# Patient Record
Sex: Male | Born: 1995 | Race: White | Hispanic: No
Health system: Southern US, Community
[De-identification: ages and names within clinical notes are randomized; demographics above are authoritative.]

## PROBLEM LIST (undated history)

## (undated) HISTORY — PX: TONSILLECTOMY: SUR1361

---
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
## 2019-12-09 ENCOUNTER — Ambulatory Visit: Payer: Self-pay | Attending: Internal Medicine

## 2019-12-09 DIAGNOSIS — Z23 Encounter for immunization: Secondary | ICD-10-CM

## 2019-12-09 NOTE — Progress Notes (Signed)
   Covid-19 Vaccination Clinic  Name:  Edward Lang    MRN: [id number] DOB: 1995/08/07  12/09/2019  Edward Lang was observed post Covid-19 immunization for 15 minutes without incident. He was provided with Vaccine Information Sheet and instruction to access the V-Safe system.   Edward Lang was instructed to call 911 with any severe reactions post vaccine: Marland Kitchen Difficulty breathing  . Swelling of face and throat  . A fast heartbeat  . A bad rash all over body  . Dizziness and weakness   Immunizations Administered    Name Date Dose VIS Date Route   JANSSEN COVID-19 VACCINE 12/09/2019  9:20 AM 0.5 mL 05/10/2019 Intramuscular   Manufacturer: [employer]   Lot: [id number]   NDC: 28003-491-79

## 2020-05-19 ENCOUNTER — Ambulatory Visit (INDEPENDENT_AMBULATORY_CARE_PROVIDER_SITE_OTHER): Payer: No Typology Code available for payment source

## 2020-05-19 ENCOUNTER — Ambulatory Visit
Admission: EM | Admit: 2020-05-19 | Discharge: 2020-05-19 | Disposition: A | Discharge status: Home / Self Care | Payer: No Typology Code available for payment source | Attending: Emergency Medicine | Admitting: Emergency Medicine

## 2020-05-19 ENCOUNTER — Observation Stay (HOSPITAL_COMMUNITY)
Admission: EM | Admit: 2020-05-19 | Discharge: 2020-05-20 | Disposition: A | Discharge status: Home / Self Care | Payer: No Typology Code available for payment source | Attending: Emergency Medicine | Admitting: Emergency Medicine

## 2020-05-19 ENCOUNTER — Other Ambulatory Visit: Payer: Self-pay

## 2020-05-19 ENCOUNTER — Encounter (HOSPITAL_COMMUNITY): Payer: Self-pay | Admitting: Emergency Medicine

## 2020-05-19 ENCOUNTER — Emergency Department (HOSPITAL_COMMUNITY): Payer: No Typology Code available for payment source

## 2020-05-19 DIAGNOSIS — R000 Tachycardia, unspecified: Secondary | ICD-10-CM | POA: Insufficient documentation

## 2020-05-19 DIAGNOSIS — Z79899 Other long term (current) drug therapy: Secondary | ICD-10-CM | POA: Diagnosis not present

## 2020-05-19 DIAGNOSIS — J45909 Unspecified asthma, uncomplicated: Secondary | ICD-10-CM | POA: Diagnosis not present

## 2020-05-19 DIAGNOSIS — Z20822 Contact with and (suspected) exposure to covid-19: Secondary | ICD-10-CM | POA: Diagnosis not present

## 2020-05-19 DIAGNOSIS — J982 Interstitial emphysema: Secondary | ICD-10-CM

## 2020-05-19 DIAGNOSIS — Z91040 Latex allergy status: Secondary | ICD-10-CM | POA: Diagnosis not present

## 2020-05-19 DIAGNOSIS — R0602 Shortness of breath: Secondary | ICD-10-CM

## 2020-05-19 DIAGNOSIS — R059 Cough, unspecified: Secondary | ICD-10-CM

## 2020-05-19 DIAGNOSIS — F17220 Nicotine dependence, chewing tobacco, uncomplicated: Secondary | ICD-10-CM | POA: Insufficient documentation

## 2020-05-19 DIAGNOSIS — R079 Chest pain, unspecified: Secondary | ICD-10-CM | POA: Diagnosis present

## 2020-05-19 LAB — BASIC METABOLIC PANEL
Anion gap: 12 (ref 5–15)
BUN: 11 mg/dL (ref 6–20)
CO2: 21 mmol/L — ABNORMAL LOW (ref 22–32)
Calcium: 9.4 mg/dL (ref 8.9–10.3)
Chloride: 104 mmol/L (ref 98–111)
Creatinine, Ser: 1.06 mg/dL (ref 0.61–1.24)
GFR, Estimated: >60 mL/min (ref >60)
Glucose, Bld: 100 mg/dL — ABNORMAL HIGH (ref 70–99)
Potassium: 3.9 mmol/L (ref 3.5–5.1)
Sodium: 137 mmol/L (ref 135–145)

## 2020-05-19 LAB — CBC
HCT: 47.1 % (ref 39.0–52.0)
Hemoglobin: 16.4 g/dL (ref 13.0–17.0)
MCH: 30.4 pg (ref 26.0–34.0)
MCHC: 34.8 g/dL (ref 30.0–36.0)
MCV: 87.4 fL (ref 80.0–100.0)
Platelets: 232 10*3/uL (ref 150–400)
RBC: 5.39 MIL/uL (ref 4.22–5.81)
RDW: 12.3 % (ref 11.5–15.5)
WBC: 10.3 10*3/uL (ref 4.0–10.5)
nRBC: 0 % (ref 0.0–0.2)

## 2020-05-19 LAB — TROPONIN I (HIGH SENSITIVITY)
Troponin I (High Sensitivity): <2 ng/L (ref <18)
Troponin I (High Sensitivity): <2 ng/L (ref <18)

## 2020-05-19 MED ORDER — PREDNISONE 50 MG PO TABS: 50.0000 mg | ORAL_TABLET | Freq: Every day | ORAL | 0 refills | Status: DC

## 2020-05-19 MED ORDER — BENZONATATE 200 MG PO CAPS: 200.0000 mg | ORAL_CAPSULE | Freq: Three times a day (TID) | ORAL | 0 refills | Status: AC | PRN

## 2020-05-19 MED ORDER — ALBUTEROL SULFATE HFA 108 (90 BASE) MCG/ACT IN AERS
2.0000 | INHALATION_SPRAY | Freq: Once | RESPIRATORY_TRACT | Status: AC
  Administered 2020-05-19: 2 via RESPIRATORY_TRACT

## 2020-05-19 MED ORDER — METHYLPREDNISOLONE SODIUM SUCC 125 MG IJ SOLR
125.0000 mg | Freq: Once | INTRAMUSCULAR | Status: AC
  Administered 2020-05-19: 125 mg via INTRAMUSCULAR

## 2020-05-19 MED ORDER — FENTANYL CITRATE (PF) 100 MCG/2ML IJ SOLN
50.0000 ug | Freq: Once | INTRAMUSCULAR | Status: AC
  Administered 2020-05-19: 50 ug via INTRAVENOUS
  Filled 2020-05-19: qty 2

## 2020-05-19 MED ORDER — PIPERACILLIN-TAZOBACTAM 3.375 G IVPB
3.3750 g | Freq: Three times a day (TID) | INTRAVENOUS | Status: DC
  Administered 2020-05-19: 3.375 g via INTRAVENOUS
  Filled 2020-05-19: qty 50

## 2020-05-19 MED ORDER — IOHEXOL 300 MG/ML  SOLN
75.0000 mL | Freq: Once | INTRAMUSCULAR | Status: AC | PRN
  Administered 2020-05-19: 75 mL via INTRAVENOUS

## 2020-05-19 MED ORDER — DM-GUAIFENESIN ER 30-600 MG PO TB12: 1.0000 | ORAL_TABLET | Freq: Two times a day (BID) | ORAL | 0 refills | Status: AC

## 2020-05-19 NOTE — ED Triage Notes (Signed)
Pt c/o SOB with deep chest congestion/tightness/wheezing since yesterday. States coughing sometimes. No distress noted, speaking in complete sentences.

## 2020-05-19 NOTE — Discharge Instructions (Addendum)
Xray showing pneumomediastinum HR 115; O2 93%

## 2020-05-19 NOTE — Progress Notes (Signed)
Pharmacy Antibiotic Note  Edward Lang is a 25 y.o. male admitted on 05/19/2020 with pneumomediastinum.  Pharmacy has been consulted for Zosyn dosing. WBC 10.3. Renal function good.   Plan: Zosyn 3.375G IV q8h to be infused over 4 hours Trend WBC, temp, renal function  F/U infectious work-up  Temp (24hrs), Avg:98.4 F (36.9 C), Min:97.7 F (36.5 C), Max:99.3 F (37.4 C)  Recent Labs  Lab 05/19/20 1745  WBC 10.3  CREATININE 1.06    CrCl cannot be calculated (Unknown ideal weight.).    Allergies  Allergen Reactions  . Latex Hives   Abran Duke, PharmD, BCPS Clinical Pharmacist Phone: [phone]

## 2020-05-19 NOTE — ED Provider Notes (Signed)
[facility]    CSN: [id number] Arrival date & time: 05/19/20  1446      History   Chief Complaint Chief Complaint  Patient presents with  . Shortness of Breath    HPI Edward Lang is a 25 y.o. male history of tonsillectomy presenting today for evaluation of shortness of breath.  Reports yesterday night developed a cough, today has developed increased rhinorrhea, congestion, wheezing, shortness of breath and chest tightness.  He denies any symptoms prior to yesterday.  Reports fever of 101 this morning.  Reports history of childhood asthma, but denies any recent flares.  He denies any smoking, but does report using chewing tobacco.  He denies any close sick contacts.  Denies history of DVT/PE.  Denies recent travel immobilization or surgeries.  Denies leg pain or leg swelling.  Denies injury or trauma to chest.  Chest tightness centrally.  HPI  History reviewed. No pertinent past medical history.  There are no problems to display for this patient.   Past Surgical History:  Procedure Laterality Date  . TONSILLECTOMY         Home Medications    Prior to Admission medications   Medication Sig Start Date End Date Taking? Authorizing Provider  benzonatate (TESSALON) 200 MG capsule Take 1 capsule (200 mg total) by mouth 3 (three) times daily as needed for up to 7 days for cough. 05/19/20 05/26/20 Yes Eliav Mechling C, PA-C  dextromethorphan-guaiFENesin (MUCINEX DM) 30-600 MG 12hr tablet Take 1 tablet by mouth 2 (two) times daily. 05/19/20  Yes Carel Schnee C, PA-C  predniSONE (DELTASONE) 50 MG tablet Take 1 tablet (50 mg total) by mouth daily with breakfast for 5 days. 05/19/20 05/24/20 Yes Jhovany Weidinger C, PA-C  ibuprofen (ADVIL,MOTRIN) 200 MG tablet Take 400 mg by mouth every 6 (six) hours as needed.    [provider]  Multiple Vitamins-Minerals (CENTRUM SILVER ULTRA MENS PO) Take 1 tablet by mouth daily.    [provider]    Family  History History reviewed. No pertinent family history.  Social History Social History   Tobacco Use  . Smoking status: Never Smoker  . Smokeless tobacco: Never Used  Substance Use Topics  . Alcohol use: Yes    Comment: occ  . Drug use: Yes    Types: Marijuana     Allergies   Latex   Review of Systems Review of Systems  Constitutional: Negative for activity change, appetite change, chills, fatigue and fever.  HENT: Positive for congestion. Negative for ear pain, rhinorrhea, sinus pressure, sore throat and trouble swallowing.   Eyes: Negative for discharge and redness.  Respiratory: Positive for cough, shortness of breath and wheezing. Negative for chest tightness.   Cardiovascular: Negative for chest pain.  Gastrointestinal: Negative for abdominal pain, diarrhea, nausea and vomiting.  Musculoskeletal: Negative for myalgias.  Skin: Negative for rash.  Neurological: Negative for dizziness, light-headedness and headaches.     Physical Exam Triage Vital Signs ED Triage Vitals  Enc Vitals Group     BP      Pulse      Resp      Temp      Temp src      SpO2      Weight      Height      Head Circumference      Peak Flow      Pain Score      Pain Loc      Pain Edu?  Excl. in GC?    No data found.  Updated Vital Signs BP 116/67 (BP Location: Left Arm)   Pulse (!) 113   Temp 97.7 F (36.5 C) (Oral)   Resp 20   SpO2 94%    Heart rate rechecked and is averaging around 113, O2 92-94%.  Visual Acuity Right Eye Distance:   Left Eye Distance:   Bilateral Distance:    Right Eye Near:   Left Eye Near:    Bilateral Near:     Physical Exam Vitals and nursing note reviewed.  Constitutional:      Appearance: He is well-developed and well-nourished.     Comments: No acute distress  HENT:     Head: Normocephalic and atraumatic.     Ears:     Comments: Bilateral ears without tenderness to palpation of external auricle, tragus and mastoid, EAC's without  erythema or swelling, TM's with good bony landmarks and cone of light. Non erythematous.     Nose: Nose normal.     Mouth/Throat:     Comments: Oral mucosa pink and moist, no tonsillar enlargement or exudate. Posterior pharynx patent and nonerythematous, no uvula deviation or swelling. Normal phonation. Eyes:     Conjunctiva/sclera: Conjunctivae normal.  Cardiovascular:     Rate and Rhythm: Tachycardia present.  Pulmonary:     Effort: Pulmonary effort is normal. No respiratory distress.     Breath sounds: Wheezing present.     Comments: Breathing comfortably at rest, inspiratory and expiratory wheezing noted throughout bilateral lung fields with rhonchi Abdominal:     General: There is no distension.  Musculoskeletal:        General: Normal range of motion.     Cervical back: Neck supple.     Comments: Bilateral lower legs symmetric without calf swelling or tenderness  Skin:    General: Skin is warm and dry.  Neurological:     Mental Status: He is alert and oriented to person, place, and time.  Psychiatric:        Mood and Affect: Mood and affect normal.      UC Treatments / Results  Labs (all labs ordered are listed, but only abnormal results are displayed) Labs Reviewed - No data to display  EKG   Radiology DG Chest 2 View  Result Date: 05/19/2020 CLINICAL DATA:  Shortness of breath with cough and tachycardia EXAM: CHEST - 2 VIEW COMPARISON:  July 05, 2011 FINDINGS: There is apparent pneumomediastinum. Lungs are clear. The heart size and pulmonary vascularity are normal. No adenopathy. No bone lesions. No pneumothorax. IMPRESSION: Pneumomediastinum. No pneumothorax. Lungs clear. Heart size normal. These results will be called to the ordering clinician or representative by the Radiologist Assistant, and communication documented in the PACS or [employer]. Electronically Signed   By: Bretta Bang III M.D.   On: 05/19/2020 15:54    Procedures Procedures (including  critical care time)  Medications Ordered in UC Medications  albuterol (VENTOLIN HFA) 108 (90 Base) MCG/ACT inhaler 2 puff (2 puffs Inhalation Given 05/19/20 1529)  methylPREDNISolone sodium succinate (SOLU-MEDROL) 125 mg/2 mL injection 125 mg (125 mg Intramuscular Given 05/19/20 1529)    Initial Impression / Assessment and Plan / UC Course  I have reviewed the triage vital signs and the nursing notes.  Pertinent labs & imaging results that were available during my care of the patient were reviewed by me and considered in my medical decision making (see chart for details).  Clinical Course as of 05/19/20 1619  Wed May 19, 2020  1545 Hr improving to around 100; O2 briefly up to 97%, improved to averaging 94% [HW]    Clinical Course User Index [HW] Iniko Robles C, PA-C    X-ray showed pneumomediastinum, no pneumothorax, discussed with Dr. Tracie Harrier, recommended further evaluation in emergency room given his tachycardia, lower O2 and chest discomfort.  Did provide inhaler and steroids initially for wheezing with slight improvement, but not fully resolved.  Patient driven by grandmother; sending via private vehicle to ED for further evaluation, possible further imaging to ensure patient stable for outpatient treatment versus inpatient.  Discussed strict return precautions. Patient verbalized understanding and is agreeable with plan.    Final Clinical Impressions(s) / UC Diagnoses   Final diagnoses:  Pneumomediastinum (HCC)     Discharge Instructions     Xray showing pneumomediastinum HR 115; O2 93%    ED Prescriptions    Medication Sig Dispense Auth. Provider   predniSONE (DELTASONE) 50 MG tablet Take 1 tablet (50 mg total) by mouth daily with breakfast for 5 days. 5 tablet Zayana Salvador C, PA-C   benzonatate (TESSALON) 200 MG capsule Take 1 capsule (200 mg total) by mouth 3 (three) times daily as needed for up to 7 days for cough. 28 capsule Kacee Sukhu C, PA-C    dextromethorphan-guaiFENesin (MUCINEX DM) 30-600 MG 12hr tablet Take 1 tablet by mouth 2 (two) times daily. 20 tablet Nayeliz Hipp, [place] C, PA-C     PDMP not reviewed this encounter.   Lew Dawes, [place] 05/19/20 1619

## 2020-05-19 NOTE — ED Triage Notes (Signed)
Patient sent from Urgent Care for further evaluation of pneumomediasteinum. Patient first noticed chest tightness last night. Patient is alert, oriented, and in no apparent distress at this time.

## 2020-05-19 NOTE — ED Provider Notes (Addendum)
[facility] EMERGENCY DEPARTMENT Provider Note   CSN: [id number] Arrival date & time: 05/19/20  1645     History Chief Complaint  Patient presents with  . Pneumomediasteinum    Edward Lang is a 25 y.o. male.  Patient presents to the emergency department with a chief complaint of chest pain and shortness of breath.  He states his symptoms started yesterday.  He reports that he has had some cough, as well as 2 episodes of posttussive emesis.  He states that this morning he woke with a fever of 101.  He has coughed up yellow sputum.  He denies any trauma.  He denies any significant vomiting, other than the 2 episodes of posttussive emesis.  He denies any significant alcohol use.  The history is provided by the patient. No language interpreter was used.       History reviewed. No pertinent past medical history.  There are no problems to display for this patient.   Past Surgical History:  Procedure Laterality Date  . TONSILLECTOMY         No family history on file.  Social History   Tobacco Use  . Smoking status: Never Smoker  . Smokeless tobacco: Never Used  Substance Use Topics  . Alcohol use: Yes    Comment: occ  . Drug use: Yes    Types: Marijuana    Home Medications Prior to Admission medications   Medication Sig Start Date End Date Taking? Authorizing Provider  benzonatate (TESSALON) 200 MG capsule Take 1 capsule (200 mg total) by mouth 3 (three) times daily as needed for up to 7 days for cough. 05/19/20 05/26/20  Wieters, Hallie C, PA-C  dextromethorphan-guaiFENesin (MUCINEX DM) 30-600 MG 12hr tablet Take 1 tablet by mouth 2 (two) times daily. 05/19/20   Wieters, Hallie C, PA-C  ibuprofen (ADVIL,MOTRIN) 200 MG tablet Take 400 mg by mouth every 6 (six) hours as needed.    [provider]  Multiple Vitamins-Minerals (CENTRUM SILVER ULTRA MENS PO) Take 1 tablet by mouth daily.    [provider]  predniSONE (DELTASONE) 50 MG  tablet Take 1 tablet (50 mg total) by mouth daily with breakfast for 5 days. 05/19/20 05/24/20  Wieters, Hallie C, PA-C    Allergies    Latex  Review of Systems   Review of Systems  All other systems reviewed and are negative.   Physical Exam Updated Vital Signs BP 124/71   Pulse (!) 107   Temp 99.3 F (37.4 C)   Resp 16   SpO2 95%   Physical Exam Vitals and nursing note reviewed.  Constitutional:      Appearance: He is well-developed and well-nourished.  HENT:     Head: Normocephalic and atraumatic.  Eyes:     Conjunctiva/sclera: Conjunctivae normal.  Cardiovascular:     Rate and Rhythm: Regular rhythm. Tachycardia present.     Heart sounds: No murmur heard.   Pulmonary:     Effort: Pulmonary effort is normal. No respiratory distress.     Breath sounds: Rhonchi present.  Abdominal:     Palpations: Abdomen is soft.     Tenderness: There is no abdominal tenderness.  Musculoskeletal:        General: No edema. Normal range of motion.     Cervical back: Neck supple.  Skin:    General: Skin is warm and dry.  Neurological:     Mental Status: He is alert and oriented to person, place, and time.  Psychiatric:  Mood and Affect: Mood and affect and mood normal.        Behavior: Behavior normal.     ED Results / Procedures / Treatments   Labs (all labs ordered are listed, but only abnormal results are displayed) Labs Reviewed  BASIC METABOLIC PANEL - Abnormal; Notable for the following components:      Result Value   CO2 21 (*)    Glucose, Bld 100 (*)    All other components within normal limits  RESP PANEL BY RT-PCR (FLU A&B, COVID) ARPGX2  CBC  TROPONIN I (HIGH SENSITIVITY)  TROPONIN I (HIGH SENSITIVITY)    EKG EKG Interpretation  Date/Time:  Wednesday May 19 2020 17:44:08 EST Ventricular Rate:  118 PR Interval:  152 QRS Duration: 86 QT Interval:  318 QTC Calculation: 445 R Axis:   80 Text Interpretation: Sinus tachycardia Right atrial  enlargement Borderline ECG Since last tracing rate faster Confirmed by Drema Pry [phone]) on 05/19/2020 11:29:05 PM   Radiology DG Chest 2 View  Result Date: 05/19/2020 CLINICAL DATA:  Shortness of breath with cough and tachycardia EXAM: CHEST - 2 VIEW COMPARISON:  July 05, 2011 FINDINGS: There is apparent pneumomediastinum. Lungs are clear. The heart size and pulmonary vascularity are normal. No adenopathy. No bone lesions. No pneumothorax. IMPRESSION: Pneumomediastinum. No pneumothorax. Lungs clear. Heart size normal. These results will be called to the ordering clinician or representative by the Radiologist Assistant, and communication documented in the PACS or [employer]. Electronically Signed   By: Bretta Bang III M.D.   On: 05/19/2020 15:54    Procedures Procedures   Medications Ordered in ED Medications  fentaNYL (SUBLIMAZE) injection 50 mcg (has no administration in time range)    ED Course  I have reviewed the triage vital signs and the nursing notes.  Pertinent labs & imaging results that were available during my care of the patient were reviewed by me and considered in my medical decision making (see chart for details).    MDM Rules/Calculators/A&P                          This patient complains of chest pain and shortness of breath, this involves an extensive number of treatment options, and is a complaint that carries with it a high risk of complications and morbidity.    Differential Dx Sent from urgent care with pneumomediastinum on chest x-ray, differential includes Boerhaave syndrome  Pertinent Labs I ordered, reviewed, and interpreted labs, which included CBC shows no leukocytosis, BMP is normal, troponin negative, Covid pending.  Imaging Interpretation I ordered CT chest with contrast which shows extensive pneumomediastinum.  We will add esophagram.  Medications I ordered medication fentanyl for pain.  Sources Previous records obtained and  reviewed seen in urgent care earlier, and had chest x-ray there that showed pneumomediastinum.  Reassessments After the interventions stated above, I reevaluated the patient and found found patient resting comfortably after pain medicine.  Consultants Family Practice residency service consulted for admission.  Plan Admit.    Final Clinical Impression(s) / ED Diagnoses Final diagnoses:  Pneumomediastinum [facility])    Rx / DC Orders ED Discharge Orders    None       Roxy Horseman, PA-C 05/20/20 0004    Roxy Horseman, PA-C 05/20/20 0031    Nira Conn, MD 05/20/20 [phone]

## 2020-05-20 ENCOUNTER — Observation Stay (HOSPITAL_COMMUNITY): Payer: No Typology Code available for payment source

## 2020-05-20 ENCOUNTER — Encounter (HOSPITAL_COMMUNITY): Payer: Self-pay | Admitting: Emergency Medicine

## 2020-05-20 DIAGNOSIS — J982 Interstitial emphysema: Secondary | ICD-10-CM | POA: Diagnosis not present

## 2020-05-20 LAB — RESP PANEL BY RT-PCR (FLU A&B, COVID) ARPGX2
Influenza A by PCR: NEGATIVE
Influenza B by PCR: NEGATIVE
SARS Coronavirus 2 by RT PCR: NEGATIVE

## 2020-05-20 LAB — HIV ANTIBODY (ROUTINE TESTING W REFLEX): HIV Screen 4th Generation wRfx: NONREACTIVE

## 2020-05-20 MED ORDER — BENZONATATE 100 MG PO CAPS
200.0000 mg | ORAL_CAPSULE | Freq: Three times a day (TID) | ORAL | Status: DC | PRN
  Administered 2020-05-20: 200 mg via ORAL
  Filled 2020-05-20: qty 2

## 2020-05-20 MED ORDER — IBUPROFEN 400 MG PO TABS: 600.0000 mg | ORAL_TABLET | Freq: Four times a day (QID) | ORAL | Status: DC | PRN

## 2020-05-20 MED ORDER — ALBUTEROL SULFATE HFA 108 (90 BASE) MCG/ACT IN AERS
2.0000 | INHALATION_SPRAY | RESPIRATORY_TRACT | Status: DC | PRN
  Administered 2020-05-20: 2 via RESPIRATORY_TRACT
  Filled 2020-05-20: qty 6.7

## 2020-05-20 MED ORDER — ACETAMINOPHEN 325 MG PO TABS: 650.0000 mg | ORAL_TABLET | Freq: Four times a day (QID) | ORAL | Status: DC | PRN

## 2020-05-20 MED ORDER — IOHEXOL 300 MG/ML  SOLN
50.0000 mL | Freq: Once | INTRAMUSCULAR | Status: AC | PRN
  Administered 2020-05-20: 50 mL via ORAL

## 2020-05-20 MED ORDER — ACETAMINOPHEN 650 MG RE SUPP: 650.0000 mg | Freq: Four times a day (QID) | RECTAL | Status: DC | PRN

## 2020-05-20 NOTE — H&P (Incomplete)
Family Medicine Teaching [facility] Admission History and Physical Service Pager: [phone]  Patient name: Edward Lang Medical record number: [id number] Date of birth: Jul 25, 1995 Age: 25 y.o. Gender: male  Primary Care Provider: Pcp, No Consultants: None Code Status: Full Preferred Emergency Contact:  Contact Information    Name Relation Home Work Mobile   Tsan,Rebica A Mother [phone]     CASS, CAROLINE Significant other   [phone]      Chief Complaint: Shortness of breath   Assessment and Plan: Edward Lang is a 25 y.o. male presenting with shortness of breath and chest pain. PMH is significant for childhood asthma.  Pneumomediastinum On 3/7 patient began to have URI symptoms (without fever, max temp 99 F) which progressively worsened into a more intense cough with productive sputum.  Coughing worsened with notable blood in his sputum after several episodes of coughing as well as in his 2 episodes of nonbilious vomiting.  On 3/9 patient presented to urgent care due to worsening chest pain and was given a steroid shot and Tessalon Perles; CXR was obtained and patient noted to have pneumomediastinum and was sent to the ED.  In the ED, patient underwent CT scanning which showed extensive pneumomediastinum and an esophagram was ordered.   FEN/GI: ***Regular diet Prophylaxis: *** Lovenox  Disposition: ***Med-surg, observation  History of Present Illness:  Edward Lang is a 25 y.o. male presenting with ***    Review Of Systems: Per HPI with the following additions: ***  Review of Systems   There are no problems to display for this patient.   Past Medical History: History reviewed. No pertinent past medical history.  Past Surgical History: Past Surgical History:  Procedure Laterality Date  . TONSILLECTOMY      Social History: Social History   Tobacco Use  . Smoking status: Never Smoker  . Smokeless tobacco: Never Used  Substance Use Topics   . Alcohol use: Yes    Comment: occ  . Drug use: Yes    Types: Marijuana   Additional social history: ***  Please also refer to relevant sections of EMR.  Family History: No family history on file. (***If not completed, MUST add something in)  Allergies and Medications: Allergies  Allergen Reactions  . Latex Hives   No current facility-administered medications on file prior to encounter.   Current Outpatient Medications on File Prior to Encounter  Medication Sig Dispense Refill  . benzonatate (TESSALON) 200 MG capsule Take 1 capsule (200 mg total) by mouth 3 (three) times daily as needed for up to 7 days for cough. 28 capsule 0  . dextromethorphan-guaiFENesin (MUCINEX DM) 30-600 MG 12hr tablet Take 1 tablet by mouth 2 (two) times daily. 20 tablet 0  . ibuprofen (ADVIL,MOTRIN) 200 MG tablet Take 400 mg by mouth every 6 (six) hours as needed.    . Multiple Vitamins-Minerals (CENTRUM SILVER ULTRA MENS PO) Take 1 tablet by mouth daily.    . predniSONE (DELTASONE) 50 MG tablet Take 1 tablet (50 mg total) by mouth daily with breakfast for 5 days. 5 tablet 0    Objective: BP 121/73   Pulse 90   Temp 98.1 F (36.7 C)   Resp 17   SpO2 95%  Exam: General -- oriented x3, pleasant and cooperative. HEENT -- Head is normocephalic. PERRLA. EOMI. Ears, nose and throat were benign. Neck -- supple; no bruits. Integument -- intact. No rash, erythema, or ecchymoses.  Chest -- good expansion. Lungs clear to auscultation. Cardiac -- RRR. No murmurs  noted.  Abdomen -- soft, nontender. No masses palpable. Bowel sounds present. Genital, rectal and breast exam -- deferred. CNS -- cranial nerves II through XII grossly intact. 2+ reflexes bilaterally. Extremeties - no tenderness or effusions noted. ROM good. 5/5 bilateral strength. Dorsalis pedis pulses present and symmetrical.    Labs and Imaging: CBC BMET  Recent Labs  Lab 05/19/20 1745  WBC 10.3  HGB 16.4  HCT 47.1  PLT 232   Recent  Labs  Lab 05/19/20 1745  NA 137  K 3.9  CL 104  CO2 21*  BUN 11  CREATININE 1.06  GLUCOSE 100*  CALCIUM 9.4     EKG: My own interpretation (not copied from electronic read) ***   DG Chest 2 View  Result Date: 05/19/2020 CLINICAL DATA:  Shortness of breath with cough and tachycardia EXAM: CHEST - 2 VIEW COMPARISON:  July 05, 2011 FINDINGS: There is apparent pneumomediastinum. Lungs are clear. The heart size and pulmonary vascularity are normal. No adenopathy. No bone lesions. No pneumothorax. IMPRESSION: Pneumomediastinum. No pneumothorax. Lungs clear. Heart size normal. These results will be called to the ordering clinician or representative by the Radiologist Assistant, and communication documented in the PACS or [employer]. Electronically Signed   By: Bretta Bang III M.D.   On: 05/19/2020 15:54   CT Chest W Contrast  Result Date: 05/19/2020 CLINICAL DATA:  Pneumomediastinum on chest radiography EXAM: CT CHEST WITH CONTRAST TECHNIQUE: Multidetector CT imaging of the chest was performed during intravenous contrast administration. CONTRAST:  54mL OMNIPAQUE IOHEXOL 300 MG/ML  SOLN COMPARISON:  Radiograph 05/19/2020 FINDINGS: Cardiovascular: Normal heart size. No pericardial effusion. The aortic root is suboptimally assessed given cardiac pulsation artifact. The aorta is normal caliber. No acute luminal abnormality of the imaged aorta. No periaortic stranding or hemorrhage. Normal 3 vessel branching of the aortic arch. Proximal great vessels are unremarkable. Central pulmonary arteries are normal caliber. No large central filling defects with more distal assessment limited by a non tailored technique. No major venous abnormalities. Mediastinum/Nodes: Extensive pneumomediastinum extending into the base of the neck and about the supra hepatic IVC. No mediastinal free fluid is seen however. Normal thyroid gland and thoracic inlet. No acute abnormality of the trachea. No abnormal esophageal  thickening, stranding or other concerning features. No worrisome mediastinal, hilar or axillary adenopathy. Lungs/Pleura: Very mild airways thickening is noted. No consolidation, features of edema, pneumothorax, or effusion. No suspicious pulmonary nodules or masses. Upper Abdomen: Atherosclerotic calcifications within the abdominal aorta and branch vessels. No aneurysm or ectasia. No enlarged abdominopelvic lymph nodes. Musculoskeletal: No acute osseous abnormality or suspicious osseous lesion. No worrisome chest wall mass or lesion. IMPRESSION: 1. Extensive pneumomediastinum extending into the base of the neck and about the supra hepatic IVC. No mediastinal free fluid, esophageal thickening or other features to implicate the esophagus as a cause of this pneumomediastinum. Could be seen in the setting of protracted coughing/barotrauma, given some additional features of mild airways thickening which could reflect a mild bronchitis the appropriate setting. Regardless, if there is further concern for esophageal perforation, esophagram or endoscopy should be performed. 2. No other acute intrathoracic process. 3. Aortic Atherosclerosis (ICD10-I70.0). These results were called by telephone at the time of interpretation on 05/19/2020 at 11:28 pm to provider Roxy Horseman , who verbally acknowledged these results. Electronically Signed   By: Kreg Shropshire M.D.   On: 05/19/2020 23:28     Evelena Leyden, DO 05/20/2020, 12:05 AM PGY-1, [facility] FPTS Intern pager:  [phone], text pages welcome

## 2020-05-20 NOTE — Hospital Course (Addendum)
Edward Lang is a 25 y.o. male who presented with shortness of breath and chest pain, found to have pneumomediastinum extending to the neck. PMH is significant for childhood asthma.   Pneumomediastinum In ED, CT scan notable for extensive pneumomediastinum extending to the base of the neck and the suprahepatic IVC.  No mediastinal free fluid, esophageal thickening or other features to implicate the esophagus as a cause. Patient reported chest pain this was likely due to his pneumomediastinum and likely caused by repeated coughing episodes from recent URI.  Troponins were less than 2.  RVP: negative. EKG was sinus tachycardia with right atrial enlargement.  Esophagram was ordered which was normal.  Patient received Zosyn in ED but this was not continued upon admission as patient never had a true fever and no concerns for infection given unremarkable labs. Symptomatic management with Albuterol and Tessalon.

## 2020-05-20 NOTE — H&P (Addendum)
Family Medicine Teaching [facility] Admission History and Physical Service Pager: [phone]  Patient name: Edward Lang Medical record number: [id number] Date of birth: Sep 19, 1995 Age: 25 y.o. Gender: male  Primary Care Provider: Pcp, No Consultants: None Code Status: Full Preferred Emergency Contact:  Contact Information    Name Relation Home Work Mobile   Villalona,Rebica A Mother [phone]     CASS, CAROLINE Significant other   [phone]      Chief Complaint: Shortness of breath   Assessment and Plan: Edward Lang is a 25 y.o. male presenting with shortness of breath and chest pain. PMH is significant for childhood asthma  Pneumomediastinum On 3/7 patient began to have URI symptoms (without fever, max temp 99 F) which progressively worsened into a more intense cough with productive sputum.  Coughing worsened with notable blood in his sputum after several episodes of coughing as well as in his 2 episodes of nonbilious vomiting.  On 3/9 patient presented to urgent care due to worsening chest pain and was given a steroid shot and Tessalon Perles; CXR was obtained and patient noted to have pneumomediastinum and was sent to the ED.  In the ED, patient underwent CT scanning which showed extensive pneumomediastinum and an esophagram was ordered and to be completed.  On physical exam, patient noted for tenderness around xiphoid process, no obvious crepitus noticed over the mediastinum.  At this time, given patient's stable vitals and overall presentation there is less concern for a rupture, though we still need to rule this out.  In the ED, patient was given Zosyn as there was reported fever previously, but upon speaking to the patient he reports that he remembered the temperature wrong and it was only 99 F, not a true fever; we will not be continuing Zosyn at this time.  Patient remains afebrile and stable, will monitor for changes in respiratory status.  No recommendation for  repeat imaging unless acutely worsening or acutely hypoxic and concern for pneumothorax. -Admit to FPTS, attending Dr. Pollie Meyer -Vital signs per routine -Follow-up esophagram -Continue to monitor respiratory status, if acute change/worsening consider repeat imaging to ensure no large pneumothorax present -Supplemental O2 as needed to maintain saturations > 92% -Continuous telemetry and pulse ox  Chest pain/pressure Patient reportedly had chest pain/pressure starting around 7 AM on 3/9, which progressively worsened.  After 2 to 3 hours at work, patient began to notice worsening of pain, became weak with palpitations and presyncopal.  Patient then went to urgent care and was given steroids and Tessalon Perles, on CXR found to have extensive pneumomediastinum (see above) and was sent to the ED.  In the ED, patient received fentanyl x1 for chest pain with reported improvement from 8/10 to 4/10. ECG notable for tachycardia and right atrial enlargement, unsure if extensive pneumomediastinum could contribute to abnormal ECG finding.  Chest pressure is likely secondary to the pneumomediastinum.  Unlikely that this chest pressure is cardiac in origin given patient's lack of risk factors and young age and his troponin was <2. -Continue to monitor chest pain/pressure -Tylenol and ibuprofen every 6 hours as needed for pain -Monitor on telemetry while here for observation   FEN/GI: Regular diet Prophylaxis: none, Low VTE risk  Disposition: Admitted for observation.  Possible discharge later in the day on 3/10  History of Present Illness:  Edward Lang is a 25 y.o. male presenting with increasing shortness of breath and chest pain/pressure.  Patient reports that on 3/7 he began to have a runny nose with a  minimal cough.  On 3/8 he started to have worsening of his cough and it became productive with green phlegm.  He continued to have worsening of his cough with awakening overnight.  He noted several  coughing episodes that had blood-tinged sputum and he had posttussive emesis x2 which was nonbilious but did have some blood similar to his sputum during coughing.  Patient did note that he had a temperature up to 99 F but no true fever.  On 3/9, patient began having some chest tightness in the morning and went to work.  2 to 3 hours after starting work he began to feel an increasing chest pressure with palpitations and weakness; he stated he felt presyncopal with sweating and hyperventilation and he "felt like I could not catch my breath".  He then went to the urgent care where he received a shot of steroids and Tessalon Perles and had a chest x-ray that showed pneumomediastinum and was told to come to the ED.  He states that he has had minimal cough since he got the shot and the [employer] (which she state was about 4 PM).  He does note that he has had minimal appetite due to his recent cough and has not eaten much today, but he states that he has been working to stay hydrated with water.  Of note, patient reports he has a history of childhood asthma and has not needed any inhalers since he was 25 years old.  He was given an albuterol inhaler at the urgent care, he has only done 2 puffs total, which he states he felt like may have helped a little bit but not very much.    Review Of Systems: Per HPI with the following additions:   Review of Systems  Constitutional: Positive for activity change and appetite change. Negative for fever.  HENT: Positive for congestion, rhinorrhea and sore throat. Negative for sinus pressure, sinus pain and sneezing.   Eyes: Negative for discharge and visual disturbance.  Respiratory: Positive for cough, chest tightness, shortness of breath and wheezing. Negative for apnea.   Cardiovascular: Negative for palpitations.  Gastrointestinal: Positive for vomiting. Negative for abdominal pain, constipation, diarrhea and nausea.  Genitourinary: Negative for dysuria and  urgency.  Neurological: Positive for weakness and headaches. Negative for dizziness and light-headedness.       Past Medical History: Asthma in childhood-no issues since he was 25 years old  Past Surgical History: Past Surgical History:  Procedure Laterality Date  . TONSILLECTOMY      Social History: Social History   Tobacco Use  . Smoking status: Never Smoker  . Smokeless tobacco: Never Used  Substance Use Topics  . Alcohol use: Yes    Comment: occ  . Drug use: Yes    Types: Marijuana   Additional social history: Patient reports he was previously smoking and vaping but quit 1 year ago, currently using tobacco chew Please also refer to relevant sections of EMR.  Family History: No family history on file.  Allergies and Medications: Allergies  Allergen Reactions  . Latex Hives   No current facility-administered medications on file prior to encounter.   Current Outpatient Medications on File Prior to Encounter  Medication Sig Dispense Refill  . benzonatate (TESSALON) 200 MG capsule Take 1 capsule (200 mg total) by mouth 3 (three) times daily as needed for up to 7 days for cough. 28 capsule 0  . dextromethorphan-guaiFENesin (MUCINEX DM) 30-600 MG 12hr tablet Take 1 tablet by mouth 2 (two)  times daily. 20 tablet 0  . ibuprofen (ADVIL,MOTRIN) 200 MG tablet Take 400 mg by mouth every 6 (six) hours as needed.    . Multiple Vitamins-Minerals (CENTRUM SILVER ULTRA MENS PO) Take 1 tablet by mouth daily.    . predniSONE (DELTASONE) 50 MG tablet Take 1 tablet (50 mg total) by mouth daily with breakfast for 5 days. 5 tablet 0    Objective: BP 121/73   Pulse 90   Temp 98.1 F (36.7 C)   Resp 17   SpO2 95%  Exam: General -- oriented x3, pleasant and cooperative. HEENT -- Head is normocephalic.EOMI.  Neck -- supple; no bruits. Integument -- intact. No rash, erythema, or ecchymoses.  Chest --good expansion.  Minimal wheeze noted diffusely, comfortable on room air, no  increased WOB.  Notable tenderness to palpation of the xiphoid process, no mediastinal crepitus appreciated on exam. Cardiac -- RRR at time of exam. No murmurs noted.  Abdomen -- soft, nontender. No masses palpable. Bowel sounds present. Genital, rectal and breast exam -- deferred. Extremeties - no tenderness or effusions noted.    Labs and Imaging: CBC BMET  Recent Labs  Lab 05/19/20 1745  WBC 10.3  HGB 16.4  HCT 47.1  PLT 232   Recent Labs  Lab 05/19/20 1745  NA 137  K 3.9  CL 104  CO2 21*  BUN 11  CREATININE 1.06  GLUCOSE 100*  CALCIUM 9.4     EKG: Sinus tachycardia with right atrial enlargement  DG Chest 2 View  Result Date: 05/19/2020 CLINICAL DATA:  Shortness of breath with cough and tachycardia EXAM: CHEST - 2 VIEW COMPARISON:  July 05, 2011 FINDINGS: There is apparent pneumomediastinum. Lungs are clear. The heart size and pulmonary vascularity are normal. No adenopathy. No bone lesions. No pneumothorax. IMPRESSION: Pneumomediastinum. No pneumothorax. Lungs clear. Heart size normal. These results will be called to the ordering clinician or representative by the Radiologist Assistant, and communication documented in the PACS or [employer]. Electronically Signed   By: Bretta Bang III M.D.   On: 05/19/2020 15:54   CT Chest W Contrast  Result Date: 05/19/2020 CLINICAL DATA:  Pneumomediastinum on chest radiography EXAM: CT CHEST WITH CONTRAST TECHNIQUE: Multidetector CT imaging of the chest was performed during intravenous contrast administration. CONTRAST:  50mL OMNIPAQUE IOHEXOL 300 MG/ML  SOLN COMPARISON:  Radiograph 05/19/2020 FINDINGS: Cardiovascular: Normal heart size. No pericardial effusion. The aortic root is suboptimally assessed given cardiac pulsation artifact. The aorta is normal caliber. No acute luminal abnormality of the imaged aorta. No periaortic stranding or hemorrhage. Normal 3 vessel branching of the aortic arch. Proximal great vessels are  unremarkable. Central pulmonary arteries are normal caliber. No large central filling defects with more distal assessment limited by a non tailored technique. No major venous abnormalities. Mediastinum/Nodes: Extensive pneumomediastinum extending into the base of the neck and about the supra hepatic IVC. No mediastinal free fluid is seen however. Normal thyroid gland and thoracic inlet. No acute abnormality of the trachea. No abnormal esophageal thickening, stranding or other concerning features. No worrisome mediastinal, hilar or axillary adenopathy. Lungs/Pleura: Very mild airways thickening is noted. No consolidation, features of edema, pneumothorax, or effusion. No suspicious pulmonary nodules or masses. Upper Abdomen: Atherosclerotic calcifications within the abdominal aorta and branch vessels. No aneurysm or ectasia. No enlarged abdominopelvic lymph nodes. Musculoskeletal: No acute osseous abnormality or suspicious osseous lesion. No worrisome chest wall mass or lesion. IMPRESSION: 1. Extensive pneumomediastinum extending into the base of the neck and about  the supra hepatic IVC. No mediastinal free fluid, esophageal thickening or other features to implicate the esophagus as a cause of this pneumomediastinum. Could be seen in the setting of protracted coughing/barotrauma, given some additional features of mild airways thickening which could reflect a mild bronchitis the appropriate setting. Regardless, if there is further concern for esophageal perforation, esophagram or endoscopy should be performed. 2. No other acute intrathoracic process. 3. Aortic Atherosclerosis (ICD10-I70.0). These results were called by telephone at the time of interpretation on 05/19/2020 at 11:28 pm to provider Roxy HorsemanOBERT BROWNING , who verbally acknowledged these results. Electronically Signed   By: Kreg ShropshirePrice  DeHay M.D.   On: 05/19/2020 23:28     Evelena LeydenLilland, Alana, DO 05/20/2020, 12:05 AM PGY-1, [facility] FPTS Intern  pager: [phone], text pages welcome  FPTS Upper-Level Resident Addendum   I have independently interviewed and examined the patient. I have discussed the above with the original author and agree with their documentation. My edits for correction/addition/clarification are in blue. Please see also any attending notes.    Mirian MoFrank, Jordyan Hardiman MD PGY-3, [facility] 05/20/2020 2:27 AM  FPTS Service pager: [phone] (text pages welcome through [facility])

## 2020-05-20 NOTE — Discharge Instructions (Signed)
Mr Edward Lang, Edward Lang were admitted with chest pain and history of recent coughing.  On imaging that we did you were found to have air between the lungs. This can be caused by extreme coughing. There is no further tests or imaging we need to do for air between the lungs. We observed you in hospital overnight and you did well. Your vital signs were stable and we can now discharge you.  If you have any similar symptoms after discharge such as chest pain, difficulty breathing, dizziness, vomiting, then please come back to the ER.  Please follow up with your PCP in the next 1-2 days.  Best wishes,   Family Medicine Team.    Pneumomediastinum  Pneumomediastinum is the presence of air in the mediastinum. This is the area of the body that is between the lungs and behind the breastbone. Mild cases of this condition may not cause problems. Severe cases can interfere with the normal functions of your heart and lungs. What are the causes? This condition happens when air leaks out of your lungs, airways, or intestines and into your mediastinum. This condition may be caused by:  Childbirth.  An injury to your chest, lung, intestine, esophagus, or abdomen.  Asthma.  Rapid ascent during scuba diving.  Use of a breathing machine (ventilator).  Inhaling or ingesting certain drugs or chemicals.  An infection in your face, neck, chest, or abdomen.  Extreme strain during coughing or vomiting.  Breathing an object into an airway. This condition can also occur without a cause. What are the signs or symptoms? Symptoms of this condition include:  Chest pain. The pain may run into your neck, shoulder, back, or arms.  Increased pain when you move, swallow, or take a deep breath.  Problems swallowing.  Problems speaking.  Changes in your voice.  Shortness of breath.  Fever.  Throat or jaw pain. Some people have no symptoms. This is often the case if the condition occurred on its own. How is  this diagnosed? This condition may be diagnosed based on:  Your symptoms.  A physical exam.  Imaging tests, such as a chest X-ray or CT scan. How is this treated? Treatment depends on how severe the condition is and whether there are complications.  If your condition is mild, you may not need treatment. Your body may slowly reabsorb the air in your mediastinum. You will stay in the hospital for observation and get medicine for pain, if you have pain.  If your condition is severe, or if the air starts to put pressure on your heart or lungs, you may need: ? Treatment for the underlying cause. ? One or more of the following procedures:  Needle aspiration. In this procedure, a needle is used to remove trapped air.  Chest tube placement. This may be done if your lung collapses.  Surgery. This may be done to repair a hole in your intestine or esophagus. Follow these instructions at home:  Until your health care provider says it is okay, avoid: ? Air travel. ? Scuba diving. ? High altitudes. ? Hard physical work. ? Exercise.  Avoid any movements that make you strain your muscles. Try not to cough, laugh hard, or lift anything heavy.  Do not use any products that contain nicotine or tobacco, such as cigarettes and e-cigarettes. If you need help quitting, ask your health care provider.  Do not use illegal drugs.  Take over-the-counter and prescription medicines only as told by your health care provider.  Contact a health care provider if:  You have a fever. Get help right away if you have:  Worsening pain in your chest, neck, jaw, or arms.  Trouble breathing.  New problems with speaking or swallowing. Summary  Pneumomediastinum is the presence of air in the mediastinum. This is the area of the body that is between the lungs and behind the breastbone. This can happen if air leaks out of your lungs, airways, or intestines and into your mediastinum.  Symptoms may include  chest, throat or jaw pain, increased pain when you move, swallow, or take a deep breath, changes in your voice, shortness of breath, and fever.  Treatment depends on how severe your condition is and if there are complications. This information is not intended to replace advice given to you by your health care provider. Make sure you discuss any questions you have with your health care provider. Document Revised: 04/04/2017 Document Reviewed: 04/04/2017 Elsevier Patient Education  2021 [employer].

## 2020-05-20 NOTE — Progress Notes (Signed)
Called pt to inform him to take steroid burst prescribed by urgent care on discharge. Pt is happy with the plan.  Towanda Octave MD  PGY-2, [facility]

## 2020-05-20 NOTE — ED Notes (Signed)
Ambulated pt in his room..his O2 went up to 100 once he started ambulated, it then went down to 98-99 and stayed within the 96-99 range.

## 2020-05-20 NOTE — ED Notes (Signed)
Pt given snack and beverage. 

## 2020-05-20 NOTE — ED Notes (Signed)
Hooked patient back up to monitor did vitals patient is resting with call bell in reach and family at bedside

## 2020-05-20 NOTE — Discharge Summary (Addendum)
Family Medicine Teaching [facility] Discharge Summary  Patient name: Edward Lang Medical record number: [id number] Date of birth: 1995/06/10 Age: 25 y.o. Gender: male Date of Admission: 05/19/2020  Date of Discharge: 05/20/2020 Admitting Physician: Latrelle Dodrill, MD  Primary Care Provider: Pcp, No Consultants: None  Indication for Hospitalization: Pneumomediastinum  Discharge Diagnoses/Problem List:  Pneumomediastinum Asthma  Disposition: Home  Discharge Condition: Stable  Discharge Exam:  Blood pressure 119/69, pulse 84, temperature 98.1 F (36.7 C), temperature source Oral, resp. rate 18, SpO2 96 %. Gen: Awake, alert, no distress, pleasant Chest: RRR, no murmur, no rubs or gallops, negative Hamman sign. Non-tender to palpation of mediastinum or chest wall Respiratory: Mild wheezing to bilateral upper lobes, good aeration, no respiratory distress, no rhonchi Abd: Nontender in all quadrants, nondistended, no rebound or guarding, no organomegaly, normoactive bowel sounds Extremities: Warm and well perfused, no edema, 2+ radial and DP pulses bilaterally  Brief Hospital Course:  Edward Lang is a 25 y.o. male who presented with shortness of breath and chest pain, found to have pneumomediastinum extending to the neck. PMH is significant for childhood asthma.   Pneumomediastinum In ED, CT scan notable for extensive pneumomediastinum extending to the base of the neck and the suprahepatic IVC.  No mediastinal free fluid, esophageal thickening or other features to implicate the esophagus as a cause. Patient reported chest pain this was likely due to his pneumomediastinum and likely caused by repeated coughing episodes from recent URI.  Troponins were less than 2.  RVP: negative. EKG was sinus tachycardia with right atrial enlargement.  Esophagram was ordered which was normal.  Patient received Zosyn in ED but this was not continued upon admission as patient never had a true  fever and no concerns for infection given unremarkable labs. Symptomatic management with Albuterol and Tessalon.    Issues for Follow Up:  None  Significant Procedures: None  Significant Labs and Imaging:  Recent Labs  Lab 05/19/20 1745  WBC 10.3  HGB 16.4  HCT 47.1  PLT 232   Recent Labs  Lab 05/19/20 1745  NA 137  K 3.9  CL 104  CO2 21*  GLUCOSE 100*  BUN 11  CREATININE 1.06  CALCIUM 9.4   DG Chest 2 View  Result Date: 05/19/2020 CLINICAL DATA:  Shortness of breath with cough and tachycardia EXAM: CHEST - 2 VIEW COMPARISON:  July 05, 2011 FINDINGS: There is apparent pneumomediastinum. Lungs are clear. The heart size and pulmonary vascularity are normal. No adenopathy. No bone lesions. No pneumothorax. IMPRESSION: Pneumomediastinum. No pneumothorax. Lungs clear. Heart size normal. These results will be called to the ordering clinician or representative by the Radiologist Assistant, and communication documented in the PACS or [employer]. Electronically Signed   By: Bretta Bang III M.D.   On: 05/19/2020 15:54   CT Chest W Contrast  Result Date: 05/19/2020 CLINICAL DATA:  Pneumomediastinum on chest radiography EXAM: CT CHEST WITH CONTRAST TECHNIQUE: Multidetector CT imaging of the chest was performed during intravenous contrast administration. CONTRAST:  30mL OMNIPAQUE IOHEXOL 300 MG/ML  SOLN COMPARISON:  Radiograph 05/19/2020 FINDINGS: Cardiovascular: Normal heart size. No pericardial effusion. The aortic root is suboptimally assessed given cardiac pulsation artifact. The aorta is normal caliber. No acute luminal abnormality of the imaged aorta. No periaortic stranding or hemorrhage. Normal 3 vessel branching of the aortic arch. Proximal great vessels are unremarkable. Central pulmonary arteries are normal caliber. No large central filling defects with more distal assessment limited by a non tailored technique. No  major venous abnormalities. Mediastinum/Nodes: Extensive  pneumomediastinum extending into the base of the neck and about the supra hepatic IVC. No mediastinal free fluid is seen however. Normal thyroid gland and thoracic inlet. No acute abnormality of the trachea. No abnormal esophageal thickening, stranding or other concerning features. No worrisome mediastinal, hilar or axillary adenopathy. Lungs/Pleura: Very mild airways thickening is noted. No consolidation, features of edema, pneumothorax, or effusion. No suspicious pulmonary nodules or masses. Upper Abdomen: Atherosclerotic calcifications within the abdominal aorta and branch vessels. No aneurysm or ectasia. No enlarged abdominopelvic lymph nodes. Musculoskeletal: No acute osseous abnormality or suspicious osseous lesion. No worrisome chest wall mass or lesion. IMPRESSION: 1. Extensive pneumomediastinum extending into the base of the neck and about the supra hepatic IVC. No mediastinal free fluid, esophageal thickening or other features to implicate the esophagus as a cause of this pneumomediastinum. Could be seen in the setting of protracted coughing/barotrauma, given some additional features of mild airways thickening which could reflect a mild bronchitis the appropriate setting. Regardless, if there is further concern for esophageal perforation, esophagram or endoscopy should be performed. 2. No other acute intrathoracic process. 3. Aortic Atherosclerosis (ICD10-I70.0). These results were called by telephone at the time of interpretation on 05/19/2020 at 11:28 pm to provider Roxy Horseman , who verbally acknowledged these results. Electronically Signed   By: Kreg Shropshire M.D.   On: 05/19/2020 23:28   DG ESOPHAGUS W SINGLE CM (SOL OR THIN BA)  Result Date: 05/20/2020 CLINICAL DATA:  25 year old with pneumomediastinum. EXAM: ESOPHOGRAM/BARIUM SWALLOW TECHNIQUE: Single contrast examination was performed using water-soluble contrast. FLUOROSCOPY TIME:  Fluoroscopy Time:  0 minutes and 36 seconds. Radiation  Exposure Index (if provided by the fluoroscopic device): 2.30 mGy Number of Acquired Spot Images: 0 COMPARISON:  CT scan 05/19/2020 FINDINGS: Normal appearance of the esophagus. No leaking contrast material to suggest esophageal perforation. Normal appearance of the esophageal mucosa. No mass or hiatal hernia. IMPRESSION: Normal esophagram. Electronically Signed   By: Rudie Meyer M.D.   On: 05/20/2020 08:37     Results/Tests Pending at Time of Discharge: None  Discharge Medications:  Allergies as of 05/20/2020       Reactions   Latex Hives        Medication List     STOP taking these medications    predniSONE 50 MG tablet Commonly known as: DELTASONE       TAKE these medications    benzonatate 200 MG capsule Commonly known as: TESSALON Take 1 capsule (200 mg total) by mouth 3 (three) times daily as needed for up to 7 days for cough.   CENTRUM SILVER ULTRA MENS PO Take 1 tablet by mouth daily.   dextromethorphan-guaiFENesin 30-600 MG 12hr tablet Commonly known as: MUCINEX DM Take 1 tablet by mouth 2 (two) times daily.   ibuprofen 200 MG tablet Commonly known as: ADVIL Take 400 mg by mouth every 6 (six) hours as needed.        Discharge Instructions: Please refer to Patient Instructions section of EMR for full details.  Patient was counseled important signs and symptoms that should prompt return to medical care, changes in medications, dietary instructions, activity restrictions, and follow up appointments.   Follow-Up Appointments:  Follow-up Information     Jovita Kussmaul, MD. Go on 05/24/2020.   Specialty: Family Medicine Why: Please arrive at 9:50 AM for a 10:05 AM appt Contact information: [address] [place] [address] [phone]  Sabino Dick, DO 05/20/2020, 11:08 AM PGY-1, [facility]

## 2020-05-24 ENCOUNTER — Other Ambulatory Visit: Payer: Self-pay

## 2020-05-24 ENCOUNTER — Ambulatory Visit (INDEPENDENT_AMBULATORY_CARE_PROVIDER_SITE_OTHER): Payer: No Typology Code available for payment source | Admitting: Family Medicine

## 2020-05-24 ENCOUNTER — Encounter: Payer: Self-pay | Admitting: Family Medicine

## 2020-05-24 DIAGNOSIS — Z7689 Persons encountering health services in other specified circumstances: Secondary | ICD-10-CM | POA: Diagnosis not present

## 2020-05-24 DIAGNOSIS — J982 Interstitial emphysema: Secondary | ICD-10-CM | POA: Diagnosis not present

## 2020-05-24 NOTE — Assessment & Plan Note (Addendum)
Patient declining flu shot or COVID booster at this time.  Currently uses chewing tobacco, will follow-up with cessation discussion in future  Needs Hep C screening at some point but will hold off today as does not need other labs currently. - follow up in 1 year

## 2020-05-24 NOTE — Assessment & Plan Note (Addendum)
Symptoms have greatly improved.  No chest wall tenderness.  Moderate wheezing appreciated in posterior right lung.   - Continue daily steroid use and Albuterol use PRN - Avoid heavy lifting and smoking cigarettes/marijuana for at least 1 week, can chew tobacco though recommend quitting - follow up in 2 weeks if not improved/mostly resolved, or sooner if symtpoms worsening

## 2020-05-24 NOTE — Patient Instructions (Signed)
It was good to see you today.  Thank you for coming in.  I am glad you are feeling better and your breathing and cough are improving.  If your breathing and cough don't continue to improve over the next 2 weeks, come back and see [place].  If it becomes worse, come back and see [place] sooner.  Avoid heavy lifting or smokinh for 1 week.  If not come back and see [place] in a year for a yearly physical or sooner if any issues.  Be Well, Dr Pecola Leisure

## 2020-05-24 NOTE — Progress Notes (Signed)
    SUBJECTIVE:   CHIEF COMPLAINT / HPI: Establish care  Patient was previously seen in Urgent care and then ED for Pneumomediastinum.  Had chest pain, increased work of breathing, and cough which all have greatly improved.  Did not have fever.  Indicates still coughing.  Is continuing daily steroid course and using albuterol inhaler 1-2 times a day.  Had asthma previously as a child but has not required inhaler use since age 25.    PERTINENT  PMH / PSH: Asthma as child  OBJECTIVE:   BP 102/60   Pulse 83   Ht 5\' 11"  (1.803 m)   Wt 201 lb (91.2 kg)   SpO2 96%   BMI 28.03 kg/m    Physical Exam Constitutional:      General: He is not in acute distress.    Appearance: Normal appearance. He is not ill-appearing.  HENT:     Head: Normocephalic and atraumatic.     Mouth/Throat:     Mouth: Mucous membranes are moist.  Cardiovascular:     Rate and Rhythm: Normal rate and regular rhythm.  Pulmonary:     Effort: Pulmonary effort is normal. No respiratory distress.     Breath sounds: No decreased air movement. Examination of the right-middle field reveals wheezing. Examination of the right-lower field reveals wheezing. Wheezing present.  Chest:     Chest wall: No tenderness.  Skin:    General: Skin is warm.  Neurological:     Mental Status: He is alert.  Psychiatric:        Mood and Affect: Mood normal.        Behavior: Behavior normal.     ASSESSMENT/PLAN:   Encounter to establish care Patient declining flu shot or COVID booster at this time.  Currently uses chewing tobacco, will follow-up with cessation discussion in future  Needs Hep C screening at some point but will hold off today as does not need other labs currently. - follow up in 1 year  Pneumomediastinum (HCC) Symptoms have greatly improved.  No chest wall tenderness.  Moderate wheezing appreciated in posterior right lung.   - Continue daily steroid use and Albuterol use PRN - Avoid heavy lifting and smoking  cigarettes/marijuana for at least 1 week, can chew tobacco though recommend quitting - follow up in 2 weeks if not improved/mostly resolved, or sooner if symtpoms worsening     , MD [facility]

## 2022-09-16 IMAGING — CT CT CHEST W/ CM
2 of 4 series · 15 of 36 positions shown, 18 images · IV contrast (APPLIED)
Comparison: Radiograph 05/19/2020

CLINICAL DATA: Pneumomediastinum on chest radiography

EXAM:
CT CHEST WITH CONTRAST
TECHNIQUE: Multidetector CT imaging of the chest was performed during
intravenous contrast administration.
CONTRAST:  75mL OMNIPAQUE IOHEXOL 300 MG/ML  SOLN

[Series 3: chest w · axial · 0.76mm/px · z∈[-427,-145]mm · 12 of 167 slices shown, 15 images]
[im 13/167  mediastinal]
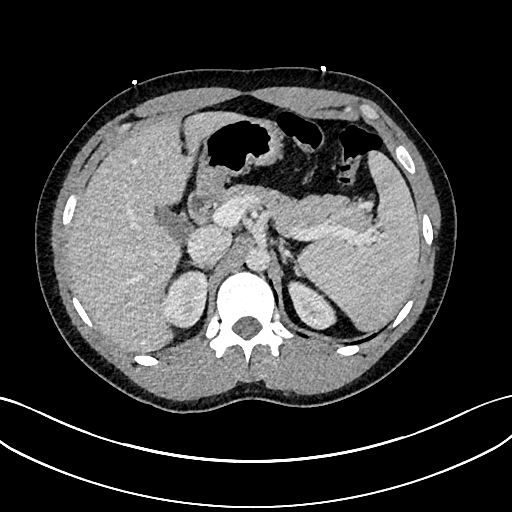
[im 13/167  lung]
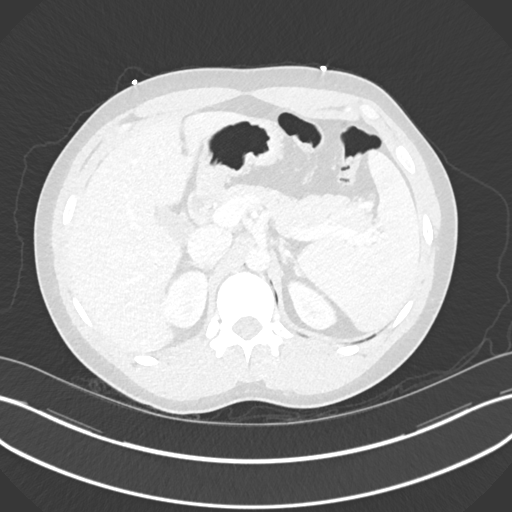
[im 26/167  lung]
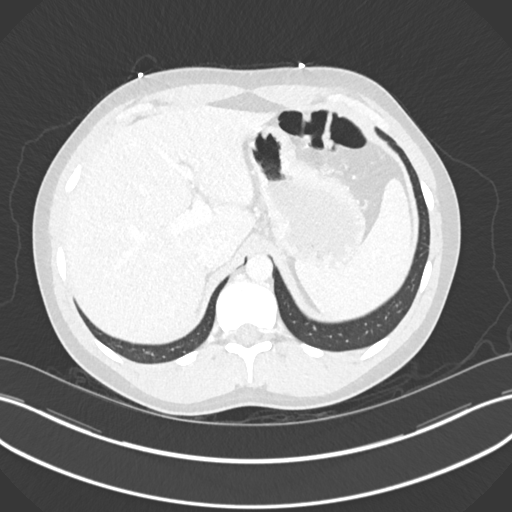
[im 39/167  lung]
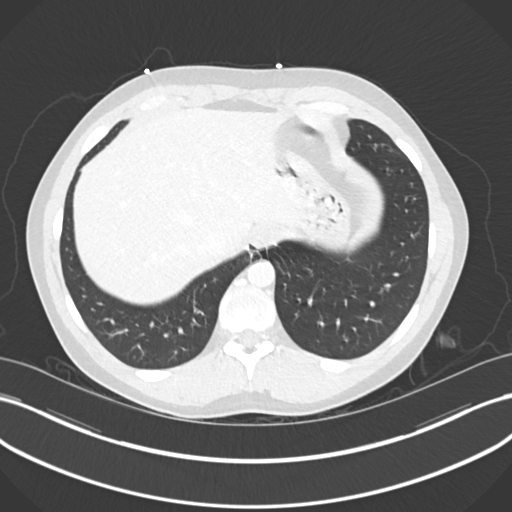
[im 52/167  lung]
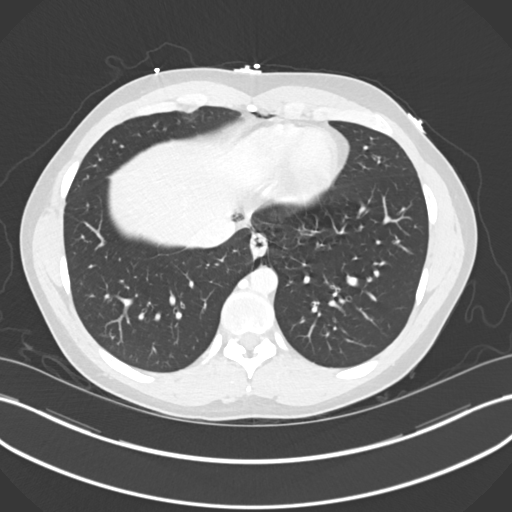
[im 64/167  mediastinal]
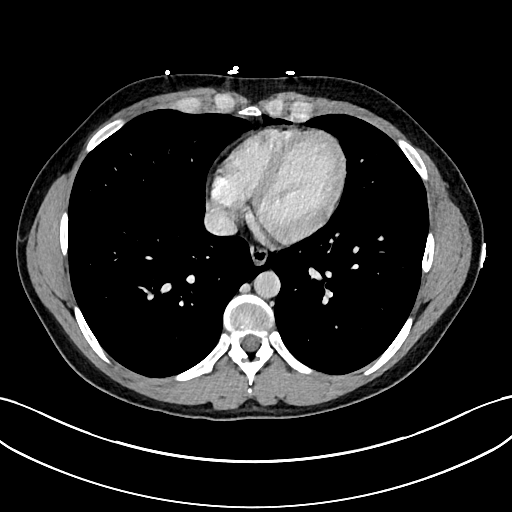
[im 64/167  lung]
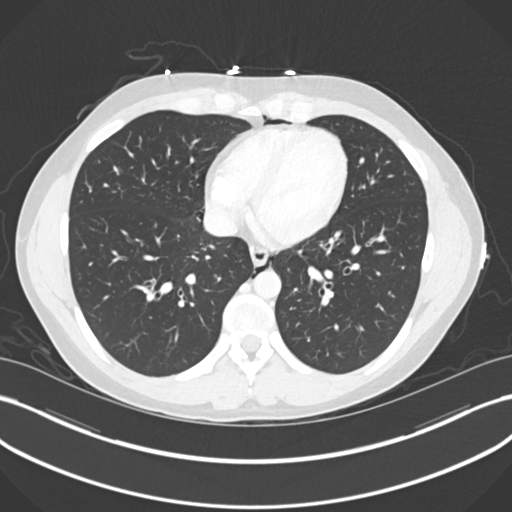
[im 77/167  lung]
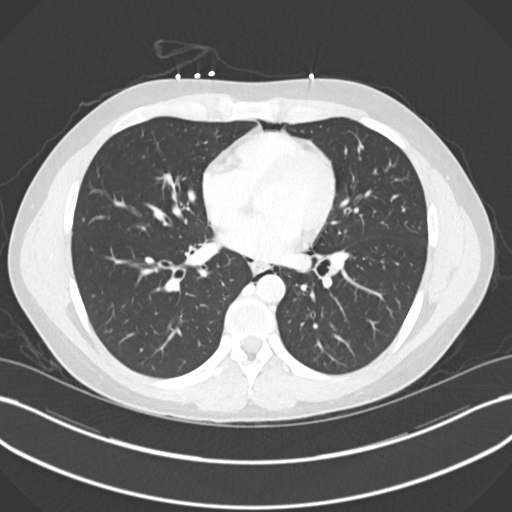
[im 90/167  lung]
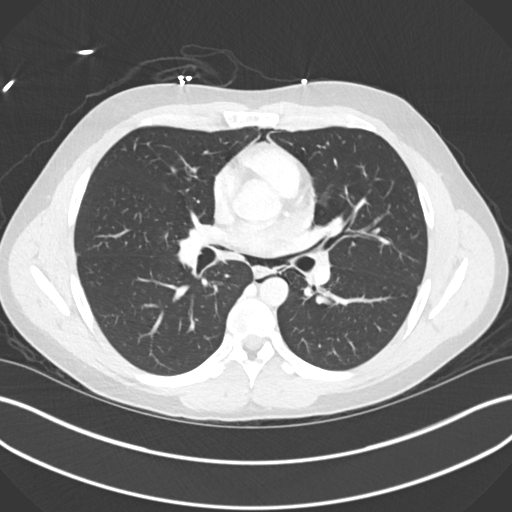
[im 103/167  lung]
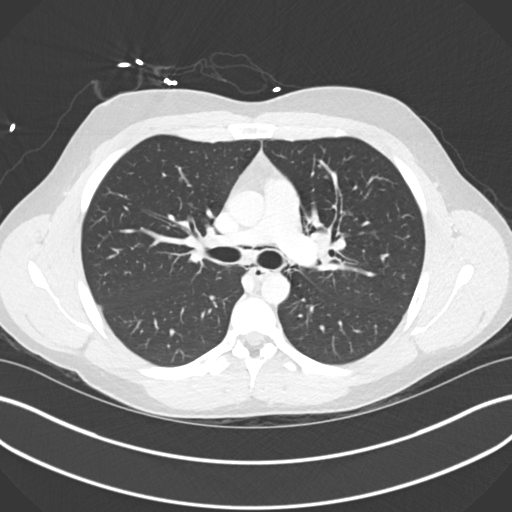
[im 115/167  mediastinal]
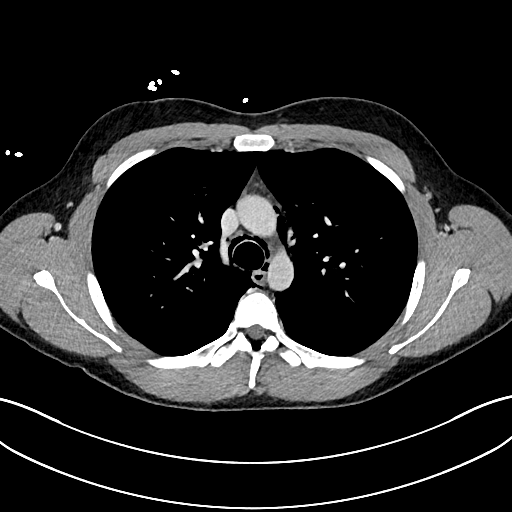
[im 115/167  lung]
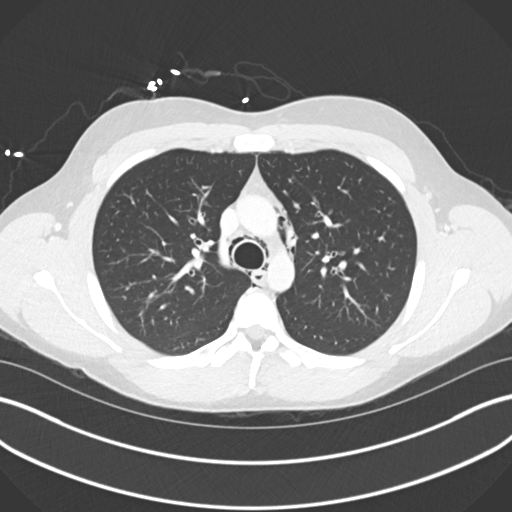
[im 128/167  lung]
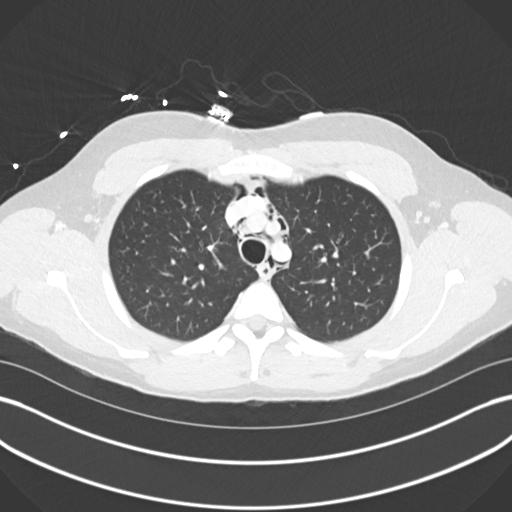
[im 141/167  lung]
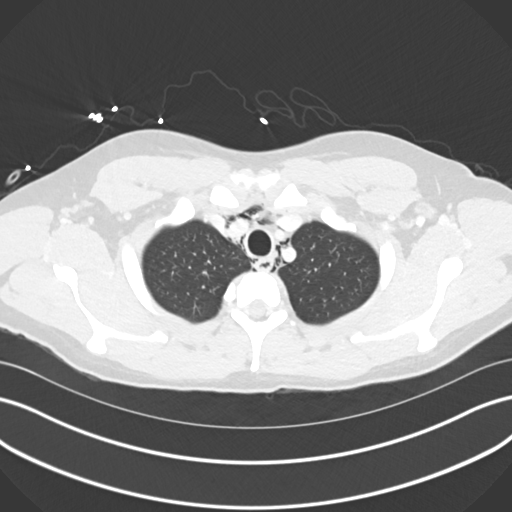
[im 154/167  lung]
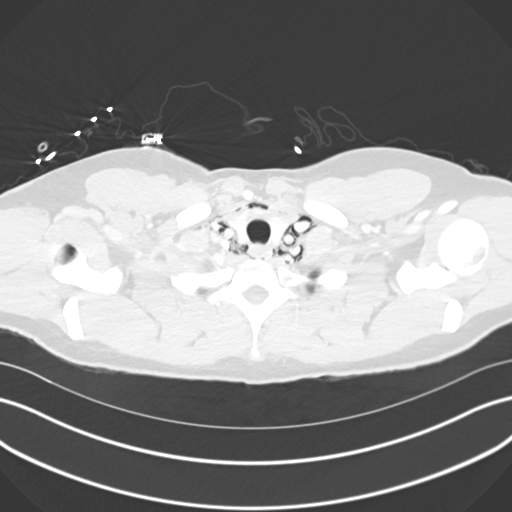

[Series 6: cor · coronal · 0.67mm/px · 3 of 133 slices shown]
[im 27/133  lung]
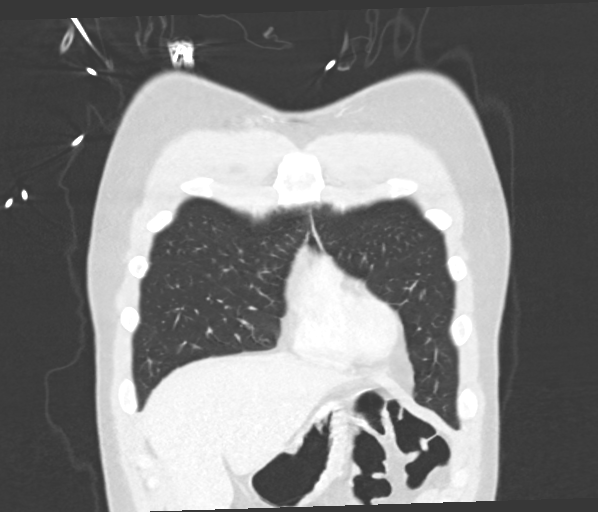
[im 53/133  lung]
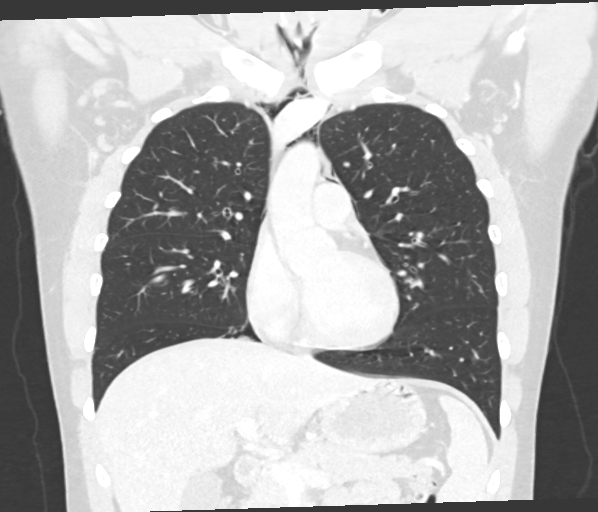
[im 80/133  lung]
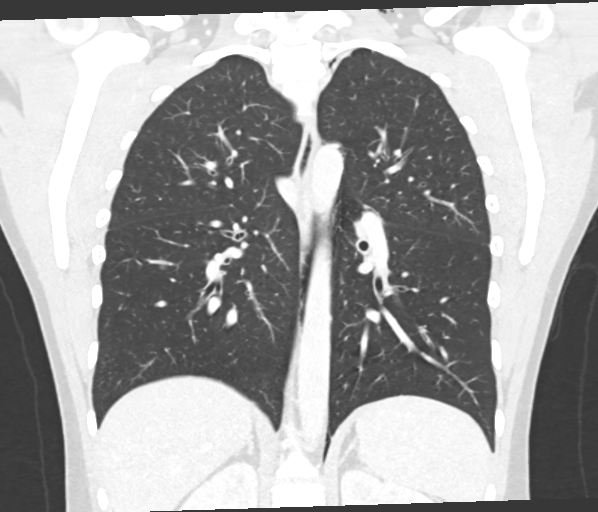

[15 of 36 positions shown; findings below may reference images not displayed]

FINDINGS: Cardiovascular: Normal heart size. No pericardial effusion. The
aortic root is suboptimally assessed given cardiac pulsation
artifact. The aorta is normal caliber. No acute luminal abnormality
of the imaged aorta. No periaortic stranding or hemorrhage. Normal 3
vessel branching of the aortic arch. Proximal great vessels are
unremarkable. Central pulmonary arteries are normal caliber. No
large central filling defects with more distal assessment limited by
a non tailored technique. No major venous abnormalities.

Mediastinum/Nodes: Extensive pneumomediastinum extending into the
base of the neck and about the supra hepatic IVC. No mediastinal
free fluid is seen however. Normal thyroid gland and thoracic inlet.
No acute abnormality of the trachea. No abnormal esophageal
thickening, stranding or other concerning features. No worrisome
mediastinal, hilar or axillary adenopathy.

Lungs/Pleura: Very mild airways thickening is noted. No
consolidation, features of edema, pneumothorax, or effusion. No
suspicious pulmonary nodules or masses.

Upper Abdomen: Atherosclerotic calcifications within the abdominal
aorta and branch vessels. No aneurysm or ectasia. No enlarged
abdominopelvic lymph nodes.

Musculoskeletal: No acute osseous abnormality or suspicious osseous
lesion. No worrisome chest wall mass or lesion.
IMPRESSION: 1. Extensive pneumomediastinum extending into the base of the neck
and about the supra hepatic IVC. No mediastinal free fluid,
esophageal thickening or other features to implicate the esophagus
as a cause of this pneumomediastinum. Could be seen in the setting
of protracted coughing/barotrauma, given some additional features of
mild airways thickening which could reflect a mild bronchitis the
appropriate setting. Regardless, if there is further concern for
esophageal perforation, esophagram or endoscopy should be performed.
2. No other acute intrathoracic process.
3. Aortic Atherosclerosis ([id number]-A28.8).

These results were called by telephone at the time of interpretation
on 05/19/2020 at [DATE] to provider PIERRE DIDIER BARILLER , who verbally
acknowledged these results.

## 2022-09-17 IMAGING — RF DG ESOPHAGUS
3 series · 12 of 12 positions shown · non-contrast
Comparison: CT scan 05/19/2020

CLINICAL DATA: 25-year-old with pneumomediastinum.

EXAM:
ESOPHOGRAM/BARIUM SWALLOW
TECHNIQUE: Single contrast examination was performed using water-soluble
contrast.
FLUOROSCOPY TIME:  Fluoroscopy Time:  0 minutes and 36 seconds.
Radiation Exposure Index (if provided by the fluoroscopic device):
2.30 mGy
Number of Acquired Spot Images: 0

[Series 1: cp_standard · 0.51mm/px · 4 of 14 frames shown (1 of 3)]
[frame 3/14]
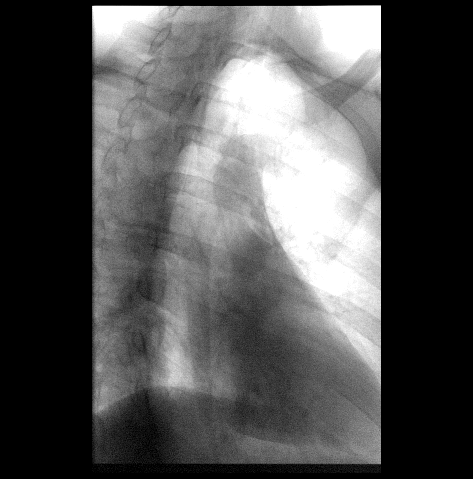
[frame 6/14]
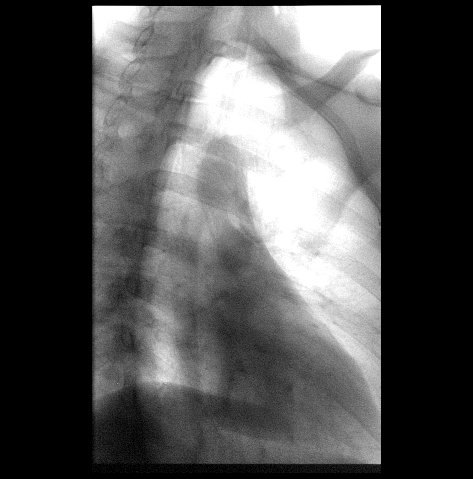
[frame 8/14]
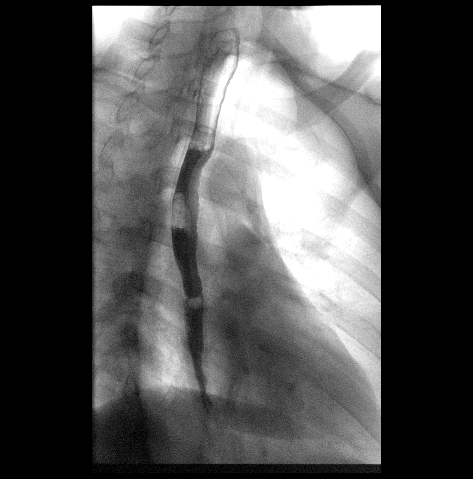
[frame 12/14]
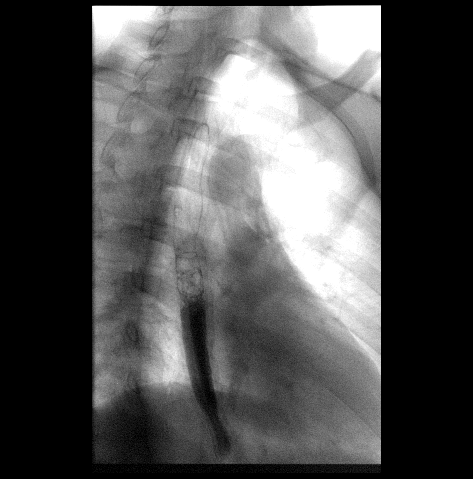

[Series 2: cp_standard · 0.51mm/px · 4 of 15 frames shown (2 of 3)]
[frame 3/15]
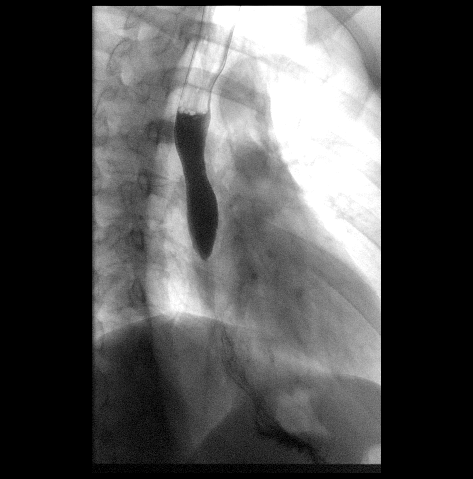
[frame 8/15]
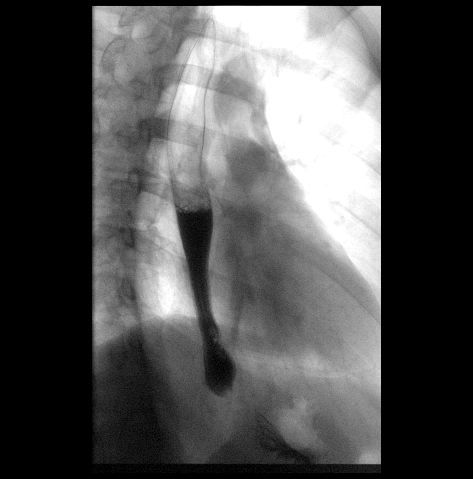
[frame 13/15]
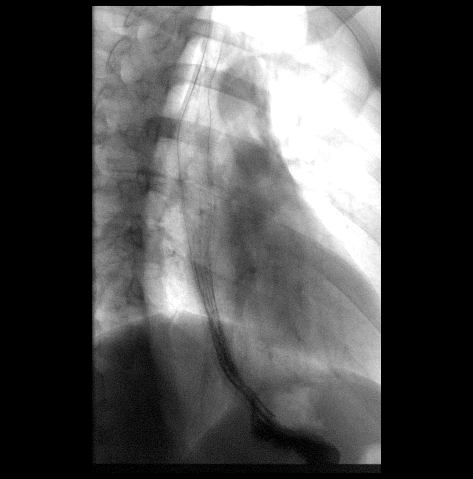
[frame 15/15]
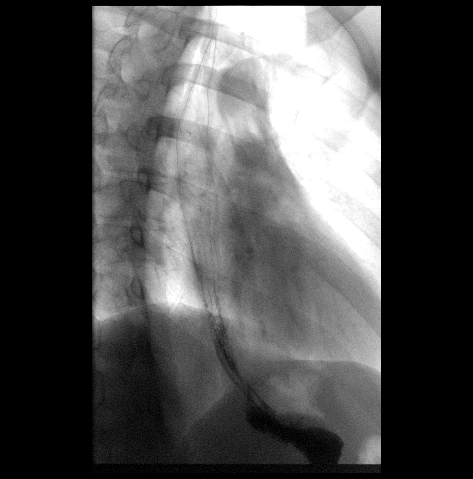

[Series 3: cp_standard · 0.51mm/px · 4 of 23 frames shown (3 of 3)]
[frame 4/23]
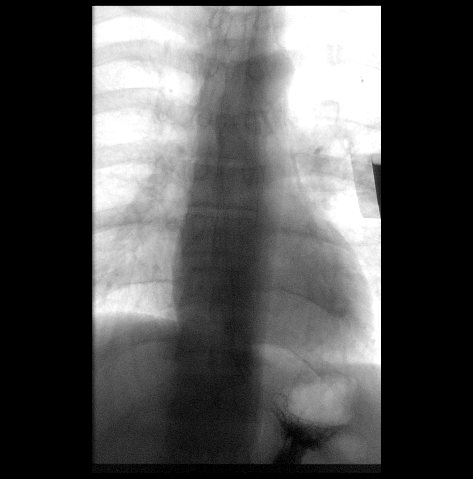
[frame 12/23]
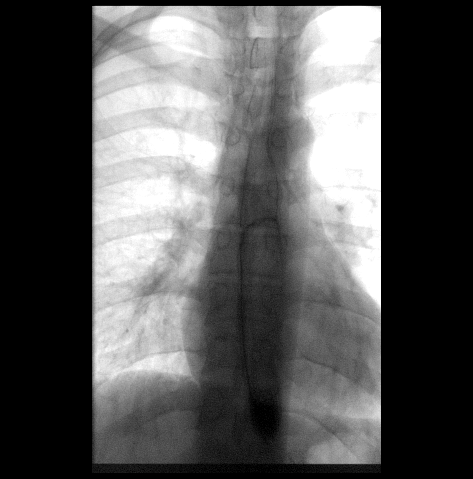
[frame 15/23]
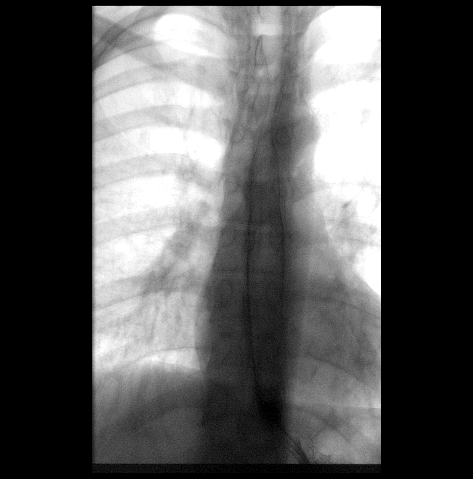
[frame 20/23]
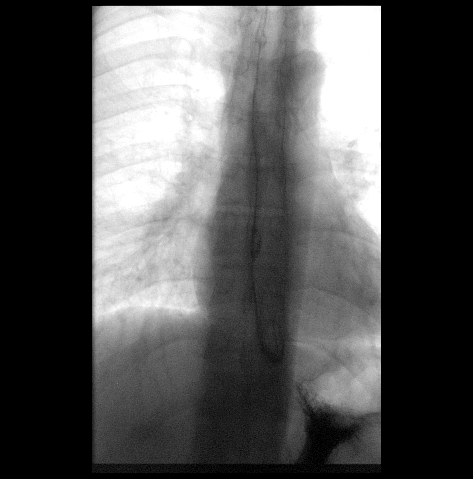

[12 of 12 positions shown; findings below may reference images not displayed]

FINDINGS: Normal appearance of the esophagus. No leaking contrast material to
suggest esophageal perforation. Normal appearance of the esophageal
mucosa. No mass or hiatal hernia.
IMPRESSION: Normal esophagram.
